# Patient Record
Sex: Male | Born: 1990 | Race: Black or African American | Hispanic: No | Marital: Single | Smoking: Never smoker
Health system: Southern US, Community
[De-identification: ages and names within clinical notes are randomized; demographics above are authoritative.]

## PROBLEM LIST (undated history)

## (undated) DIAGNOSIS — J45909 Unspecified asthma, uncomplicated: Secondary | ICD-10-CM

## (undated) DIAGNOSIS — T7840XA Allergy, unspecified, initial encounter: Secondary | ICD-10-CM

## (undated) DIAGNOSIS — F419 Anxiety disorder, unspecified: Secondary | ICD-10-CM

## (undated) HISTORY — DX: Unspecified asthma, uncomplicated: J45.909

## (undated) HISTORY — DX: Allergy, unspecified, initial encounter: T78.40XA

## (undated) HISTORY — DX: Anxiety disorder, unspecified: F41.9

---
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
## 2015-07-16 ENCOUNTER — Ambulatory Visit (INDEPENDENT_AMBULATORY_CARE_PROVIDER_SITE_OTHER): Payer: BLUE CROSS/BLUE SHIELD

## 2015-07-16 ENCOUNTER — Ambulatory Visit (INDEPENDENT_AMBULATORY_CARE_PROVIDER_SITE_OTHER): Payer: BLUE CROSS/BLUE SHIELD | Admitting: Emergency Medicine

## 2015-07-16 VITALS — BP 110/60 | HR 73 | Temp 98.0°F | Resp 20 | Ht 75.0 in | Wt 207.4 lb

## 2015-07-16 DIAGNOSIS — M79674 Pain in right toe(s): Secondary | ICD-10-CM | POA: Diagnosis not present

## 2015-07-16 DIAGNOSIS — S93502A Unspecified sprain of left great toe, initial encounter: Secondary | ICD-10-CM

## 2015-07-16 MED ORDER — NAPROXEN SODIUM 550 MG PO TABS: 550.0000 mg | ORAL_TABLET | Freq: Two times a day (BID) | ORAL | Status: AC

## 2015-07-16 NOTE — Progress Notes (Signed)
Subjective:  Patient ID: Andrew Todd, male    DOB: 06/01/1991  Age: 24 y.o. MRN: [id number]  CC: Toe Injury   HPI Andrew Todd presents   With an injury to his toe. Andrew Todd was playing football and Andrew Todd was great toe. Andrew Todd has pain with ambulation and weightbearing. Andrew Todd works in a [employer] and walks a great deal. Andrew Todd is concerned that Andrew Todd is not able to work because of pain. Andrew Todd denies any other complaints.  History Andrew Todd has a past medical history of Allergy; Asthma; and Anxiety.   Andrew Todd has no past surgical history on file.   His  family history is not on file.  Andrew Todd   reports that Andrew Todd has never smoked. Andrew Todd does not have any smokeless tobacco history on file. Andrew Todd reports that Andrew Todd drinks about 0.6 oz of alcohol per week. Andrew Todd reports that Andrew Todd does not use illicit drugs.  No outpatient prescriptions prior to visit.   No facility-administered medications prior to visit.    Social History   Social History  . Marital Status: Single    Spouse Name: N/A  . Number of Children: N/A  . Years of Education: N/A   Social History Main Topics  . Smoking status: Never Smoker   . Smokeless tobacco: None  . Alcohol Use: 0.6 oz/week    1 Standard drinks or equivalent per week  . Drug Use: No  . Sexual Activity: Not Asked   Other Topics Concern  . None   Social History Narrative  . None     Review of Systems  Constitutional: Negative for fever, chills and appetite change.  HENT: Negative for congestion, ear pain, postnasal drip, sinus pressure and sore throat.   Eyes: Negative for pain and redness.  Respiratory: Negative for cough, shortness of breath and wheezing.   Cardiovascular: Negative for leg swelling.  Gastrointestinal: Negative for nausea, vomiting, abdominal pain, diarrhea, constipation and blood in stool.  Endocrine: Negative for polyuria.  Genitourinary: Negative for dysuria, urgency, frequency and flank pain.  Musculoskeletal: Negative for gait problem.  Skin:  Negative for rash.  Neurological: Negative for weakness and headaches.  Psychiatric/Behavioral: Negative for confusion and decreased concentration. The patient is not nervous/anxious.     Objective:  BP 110/60 mmHg  Pulse 73  Temp(Src) 98 F (36.7 C) (Oral)  Resp 20  Ht 6\' 3"  (1.905 m)  Wt 207 lb 6.4 oz (94.076 kg)  BMI 25.92 kg/m2  SpO2 98%  Physical Exam  Constitutional: Andrew Todd is oriented to person, place, and time. Andrew Todd appears well-developed and well-nourished. No distress.  HENT:  Head: Normocephalic and atraumatic.  Right Ear: External ear normal.  Left Ear: External ear normal.  Nose: Nose normal.  Eyes: Conjunctivae and EOM are normal. Pupils are equal, round, and reactive to light. No scleral icterus.  Neck: Normal range of motion. Neck supple. No tracheal deviation present.  Cardiovascular: Normal rate, regular rhythm and normal heart sounds.   Pulmonary/Chest: Effort normal. No respiratory distress. Andrew Todd has no wheezes. Andrew Todd has no rales.  Abdominal: Andrew Todd exhibits no mass. There is no tenderness. There is no rebound and no guarding.  Musculoskeletal: Andrew Todd exhibits no edema.       Right foot: There is decreased range of motion and tenderness.   Andrew Todd has tenderness in his right toe on both  Metatarsal phalangeal joint in the DIP joint is no deformity or ecchymosis. No swellingl  Lymphadenopathy:    Andrew Todd has no cervical adenopathy.  Neurological:  Andrew Todd is alert and oriented to person, place, and time. Coordination normal.  Skin: Skin is warm and dry. No rash noted.  Psychiatric: Andrew Todd has a normal mood and affect. His behavior is normal.      Assessment & Plan:   Arrow was seen today for toe injury.  Diagnoses and all orders for this visit:  Pain of toe of right foot -     DG Toe Great Right; Future  Sprain of toe, great, left, initial encounter  Other orders -     naproxen sodium (ANAPROX DS) 550 MG tablet; Take 1 tablet (550 mg total) by mouth 2 (two) times daily with a  meal.  I am having [name] start on naproxen sodium.  Meds ordered this encounter  Medications  . naproxen sodium (ANAPROX DS) 550 MG tablet    Sig: Take 1 tablet (550 mg total) by mouth 2 (two) times daily with a meal.    Dispense:  40 tablet    Refill:  0    Appropriate red flag conditions were discussed with the patient as well as actions that should be taken.  Patient expressed his understanding.  Follow-up: Return if symptoms worsen or fail to improve.  Carmelina Dane, MD   [facility] reading (PRIMARY) by  Dr. Dareen Piano  negative.

## 2015-07-16 NOTE — Patient Instructions (Signed)
Turf Toe Turf toe is a condition of pain at the base of the big toe, located at the ball of the foot. The condition is usually caused from either jamming or extending the toe beyond normal limits (hyperextension). This is the result of pushing off repeatedly when running or jumping. The main problem is pain at the base of the toe, but there may also be stiffness and swelling. The name turf toe comes from the fact that this injury is especially common among athletes who play on hard surfaces, such as artificial turf and basketball courts. Hard surfaces combined with running and jumping makes this a common sports injury. DIAGNOSIS  The diagnosis of turftoeisnotdifficult. It is made by examination. X-rays may be taken to make sure there is nobreak in the bone (fracture). Not doing surgery (conservative treatment) solves the problem most of the time. Conservative treatment includes the following home care instructions. HOME CARE INSTRUCTIONS   Apply ice to the sore area for 15-20 minutes, 03-04 times per day while awake, for the first 4 days. Put the ice in a plastic bag and place a towel between the bag of ice and your skin. Use ice if possible following any activities, even after the first four days.  Keep your leg elevated when possible to lessen swelling and discomfort in the toe.  Use crutches with non-weight bearing on the affected foot for ten days, or as needed for pain. Then you may walk as the pain allows, or as instructed. Start gradually with weight bearing on the affected foot. Shoes with stiff soles will generally be helpful in limiting pain for the first 1 to 2 weeks.  Continue to use crutches or a cane until you can stand on your foot without causing pain.  Only take over-the-counter or prescription medicines for pain, discomfort, or fever as directed by your caregiver. SEEK IMMEDIATE MEDICAL CARE IF:   You have an increase in bruising, swelling, or pain in your toe.  Pain relief is  not obtained with medications. Turf toe can return, and problems may be slow to improve. This is more common if you return to athletic activities too soon and do not allow the problem to fully recover. Surgery is rarely needed, but in certain cases it may be necessary. If a bone spur forms and severely limits motion of the toe joint, surgery to remove the spur and improve motion of the big toe may be helpful.   This information is not intended to replace advice given to you by your health care provider. Make sure you discuss any questions you have with your health care provider.   Document Released: 12/26/2001 Document Revised: 09/28/2011 Document Reviewed: 01/16/2015 Elsevier Interactive Patient Education 2016 [employer]vier Inc.  

## 2017-03-10 ENCOUNTER — Encounter (HOSPITAL_COMMUNITY): Payer: Self-pay | Admitting: Emergency Medicine

## 2017-03-10 ENCOUNTER — Emergency Department (HOSPITAL_COMMUNITY)
Admission: EM | Admit: 2017-03-10 | Discharge: 2017-03-10 | Disposition: A | Discharge status: Home / Self Care | Payer: Self-pay | Attending: Emergency Medicine | Admitting: Emergency Medicine

## 2017-03-10 DIAGNOSIS — W57XXXA Bitten or stung by nonvenomous insect and other nonvenomous arthropods, initial encounter: Secondary | ICD-10-CM | POA: Insufficient documentation

## 2017-03-10 DIAGNOSIS — T148XXA Other injury of unspecified body region, initial encounter: Secondary | ICD-10-CM

## 2017-03-10 DIAGNOSIS — J45909 Unspecified asthma, uncomplicated: Secondary | ICD-10-CM | POA: Insufficient documentation

## 2017-03-10 DIAGNOSIS — Y998 Other external cause status: Secondary | ICD-10-CM | POA: Insufficient documentation

## 2017-03-10 DIAGNOSIS — Y929 Unspecified place or not applicable: Secondary | ICD-10-CM | POA: Insufficient documentation

## 2017-03-10 DIAGNOSIS — S30821A Blister (nonthermal) of abdominal wall, initial encounter: Secondary | ICD-10-CM | POA: Insufficient documentation

## 2017-03-10 DIAGNOSIS — Y939 Activity, unspecified: Secondary | ICD-10-CM | POA: Insufficient documentation

## 2017-03-10 NOTE — Discharge Instructions (Signed)
Based on your exam you have a blister, we do not know what caused it. At this time, we will watch and wait. Monitor the blister. Avoid poking it or rupturing it. If it bursts on its own, keep it clean with water and soap. Apply antibiotic ointment on it and keep clean. Return if symptoms worsen or you develop other concerning symptoms.

## 2017-03-10 NOTE — ED Provider Notes (Signed)
  Face-to-face evaluation   History: He presents for evaluation of a lesion on his abdominal wall, unknown etiology, present, 1 day.  No known trauma.  Recently visited the beach.  Physical exam: Alert, cooperative.  Right lower abdominal wall, with 2 small blistered areas, which are nontender, nonbleeding, nonpurulent.  These are nonspecific in appearance, do not indicate any deep tissue infection, signs or symptoms of arthropod bite or sting.  Medical screening examination/treatment/procedure(s) were conducted as a shared visit with non-physician practitioner(s) and myself.  I personally evaluated the patient during the encounter   Mancel Bale, MD 03/11/17 [phone]

## 2017-03-10 NOTE — ED Provider Notes (Signed)
[facility] Provider Note   CSN: [id number] Arrival date & time: 03/10/17  1530     History   Chief Complaint Chief Complaint  Patient presents with  . Insect Bite    HPI Andrew Todd is a 26 y.o. male presents to ED for evaluation of possible insect bite to right lower abdomen. He woke up 24 hr ago and noticed two bumps to his abdomen, non tender, non pruritic, no discharge. He thinks it was an insect. Lesions have been slightly enlarging. No alleviating or aggravating factors. States he went to the beach this past weekend, stayed at an [employer]. No other friends or family with similar lesions. Denies exposures to woods/ticks, fires or chemicals. No fever, myalgias, arthralgias, abdominal pain or other complaints today. Denies new sexual partners. No lesions in genitals.   HPI  Past Medical History:  Diagnosis Date  . Allergy   . Anxiety   . Asthma     Patient Active Problem List   Diagnosis Date Noted  . 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 FX CLOSED METATARSAL 03/10/2007  . RHINITIS, ALLERGIC 09/16/2006  . HAND PAIN 09/16/2006    History reviewed. No pertinent surgical history.     Home Medications    Prior to Admission medications   Not on File    Family History No family history on file.  Social History Social History  Substance Use Topics  . Smoking status: Never Smoker  . Smokeless tobacco: Never Used  . Alcohol use 0.6 oz/week    1 Standard drinks or equivalent per week     Allergies   Banana flavor   Review of Systems Review of Systems  Constitutional: Negative for fever.  HENT: Negative for sore throat.   Respiratory: Negative for cough.   Gastrointestinal: Negative for abdominal pain.  Musculoskeletal: Negative for arthralgias and myalgias.  Skin:       +lesion  Allergic/Immunologic: Negative for immunocompromised state.  Neurological: Negative for headaches.     Physical  Exam Updated Vital Signs BP (!) 121/48 (BP Location: Left Arm)   Pulse (!) 56   Temp 98.1 F (36.7 C) (Oral)   Resp 16   Ht 6\' 3"  (1.905 m)   Wt 93 kg (205 lb)   SpO2 100%   BMI 25.62 kg/m   Physical Exam  Constitutional: He is oriented to person, place, and time. He appears well-developed and well-nourished. No distress.  NAD.  HENT:  Head: Normocephalic and atraumatic.  Right Ear: External ear normal.  Left Ear: External ear normal.  Nose: Nose normal.  No perioral or intraoral lesions  Oropharynx and tonsils normal   Eyes: Conjunctivae and EOM are normal. No scleral icterus.  Neck: Normal range of motion. Neck supple.  Cardiovascular: Normal rate, regular rhythm, normal heart sounds and intact distal pulses.   No murmur heard. Pulmonary/Chest: Effort normal and breath sounds normal. He has no wheezes.  Abdominal: Soft. There is no tenderness.  Musculoskeletal: Normal range of motion. He exhibits no deformity.  Neurological: He is alert and oriented to person, place, and time.  Skin: Skin is warm and dry. Capillary refill takes less than 2 seconds.  Two vesicular lesions to lower right abdomen with very mild surrounding erythema. Non tender. Initially appears umbilicated but this change after palpation as fluid inside disperesed. Non umbilicated lesion.   Psychiatric: He has a normal mood and affect. His behavior is normal. Judgment and thought content normal.  Nursing note and vitals reviewed.      ED Treatments / Results  Labs (all labs ordered are listed, but only abnormal results are displayed) Labs Reviewed - No data to display  EKG  EKG Interpretation None       Radiology No results found.  Procedures Procedures (including critical care time)  Medications Ordered in ED Medications - No data to display   Initial Impression / Assessment and Plan / ED Course  I have reviewed the triage vital signs and the nursing notes.  Pertinent labs & imaging  results that were available during my care of the patient were reviewed by me and considered in my medical decision making (see chart for details).    Unknown etiology of blisters to abdomen. Considered exposure to allergen vs burn vs insect bite from the beach. Lesions are non pruritic and non tender. No other systemic symptoms. No lesions intraorally or periorally or in genitals. No lesions in palms or soles. Will opt for conservative tx at this time and watch and wait. Advised pt to avoid blister clean and avoid touching it. Wound care instructions given. Pt verbalized understanding with ED tx and plan. He is aware of symptoms to monitor for that would warrant return for evaluation.   Patient, ED treatment and discharge plan was discussed with supervising physician who also evaluated the patient and is agreeable with plan.   Final Clinical Impressions(s) / ED Diagnoses   Final diagnoses:  Blister    New Prescriptions New Prescriptions   No medications on file     Jerrell Mylar 03/10/17 Ocie Cornfield, MD 03/11/17 [phone]

## 2017-03-10 NOTE — ED Notes (Signed)
Declined W/C at D/C and was escorted to lobby by RN. 

## 2017-03-10 NOTE — ED Triage Notes (Signed)
Pt. Stated, I think I got bit by a spider yesterday , its on my stomach.

## 2018-08-09 ENCOUNTER — Encounter (HOSPITAL_COMMUNITY): Payer: Self-pay

## 2018-08-09 ENCOUNTER — Ambulatory Visit (HOSPITAL_COMMUNITY)
Admission: EM | Admit: 2018-08-09 | Discharge: 2018-08-09 | Disposition: A | Discharge status: Home / Self Care | Payer: Self-pay | Attending: Emergency Medicine | Admitting: Emergency Medicine

## 2018-08-09 DIAGNOSIS — H1032 Unspecified acute conjunctivitis, left eye: Secondary | ICD-10-CM | POA: Insufficient documentation

## 2018-08-09 MED ORDER — POLYMYXIN B-TRIMETHOPRIM 10000-0.1 UNIT/ML-% OP SOLN: 1.0000 [drp] | OPHTHALMIC | 0 refills | Status: DC

## 2018-08-09 NOTE — ED Triage Notes (Signed)
Pt c/o lt eye redness and drainage x2days

## 2018-08-09 NOTE — Discharge Instructions (Signed)
Conjunctivitis - Have Good Hand Hygiene  - Use Cold Compresses  - Use Polytrim eye drops (2 drops 4 times a day for 5-7 days)   Follow up if developing changes in vision, pain, persistent symptoms, swelling around eye

## 2018-08-09 NOTE — ED Provider Notes (Signed)
[facility]    CSN: [id number] Arrival date & time: 08/09/18  0846     History   Chief Complaint Chief Complaint  Patient presents with  . Eye Drainage    HPI Andrew Todd is a 28 y.o. male history of asthma presenting today for evaluation of left eye redness.  Patient began 3 days ago with a foreign body sensation to his left eye.  Since he has developed increased redness and watery drainage from the side.  Denies symptoms in right eye.  Denies change in vision, but has frequent blurriness related to the drainage.  He has tried over-the-counter eyedrops without relief.  Denies associated URI symptoms.  Denies contact use.  Denies exposure to anything that may have irritated eye or scratched eye.  HPI  Past Medical History:  Diagnosis Date  . Allergy   . Anxiety   . Asthma     Patient Active Problem List   Diagnosis Date Noted  . 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 FX CLOSED METATARSAL 03/10/2007  . RHINITIS, ALLERGIC 09/16/2006  . HAND PAIN 09/16/2006    History reviewed. No pertinent surgical history.     Home Medications    Prior to Admission medications   Medication Sig Start Date End Date Taking? Authorizing Provider  trimethoprim-polymyxin b (POLYTRIM) ophthalmic solution Place 1 drop into the left eye every 4 (four) hours. 08/09/18   Wieters, Junius Creamer, PA-C    Family History No family history on file.  Social History Social History   Tobacco Use  . Smoking status: Never Smoker  . Smokeless tobacco: Never Used  Substance Use Topics  . Alcohol use: Yes    Alcohol/week: 1.0 standard drinks    Types: 1 Standard drinks or equivalent per week  . Drug use: No     Allergies   Banana flavor   Review of Systems Review of Systems  Constitutional: Negative for activity change, appetite change, chills, fatigue and fever.  HENT: Negative for congestion, ear pain, rhinorrhea, sinus pressure, sore  throat and trouble swallowing.   Eyes: Positive for discharge, redness and visual disturbance. Negative for photophobia, pain and itching.  Respiratory: Negative for cough, chest tightness and shortness of breath.   Cardiovascular: Negative for chest pain.  Gastrointestinal: Negative for abdominal pain, diarrhea, nausea and vomiting.  Musculoskeletal: Negative for myalgias.  Skin: Negative for rash.  Neurological: Negative for dizziness, light-headedness and headaches.     Physical Exam Triage Vital Signs ED Triage Vitals  Enc Vitals Group     BP 08/09/18 0902 132/75     Pulse Rate 08/09/18 0902 (!) 57     Resp 08/09/18 0902 18     Temp 08/09/18 0902 97.9 F (36.6 C)     Temp Source 08/09/18 0902 Oral     SpO2 08/09/18 0902 100 %     Weight --      Height --      Head Circumference --      Peak Flow --      Pain Score 08/09/18 0903 0     Pain Loc --      Pain Edu? --      Excl. in GC? --    No data found.  Updated Vital Signs BP 132/75 (BP Location: Left Arm)   Pulse (!) 57   Temp 97.9 F (36.6 C) (Oral)   Resp 18   SpO2 100%   Visual Acuity Right Eye Distance:  20/40 Left Eye Distance:  20/40 Bilateral Distance:  20/30  Right Eye Near:   Left Eye Near:    Bilateral Near:     Physical Exam Vitals signs and nursing note reviewed.  Constitutional:      Appearance: He is well-developed.  HENT:     Head: Normocephalic and atraumatic.     Ears:     Comments: Bilateral ears without tenderness to palpation of external auricle, tragus and mastoid, EAC's without erythema or swelling, TM's with good bony landmarks and cone of light. Non erythematous.    Nose:     Comments: Nasal mucosa pink, nonswollen turbinates    Mouth/Throat:     Comments: Oral mucosa pink and moist, no tonsillar enlargement or exudate. Posterior pharynx patent and nonerythematous, no uvula deviation or swelling. Normal phonation. Eyes:     Extraocular Movements: Extraocular movements intact.       Pupils: Pupils are equal, round, and reactive to light.     Comments: Right conjunctiva-with erythema, limbus clear, watery drainage present  Neck:     Musculoskeletal: Neck supple.  Cardiovascular:     Rate and Rhythm: Normal rate and regular rhythm.     Heart sounds: No murmur.  Pulmonary:     Effort: Pulmonary effort is normal. No respiratory distress.     Breath sounds: Normal breath sounds.  Abdominal:     Palpations: Abdomen is soft.     Tenderness: There is no abdominal tenderness.  Skin:    General: Skin is warm and dry.  Neurological:     Mental Status: He is alert.      UC Treatments / Results  Labs (all labs ordered are listed, but only abnormal results are displayed) Labs Reviewed - No data to display  EKG None  Radiology No results found.  Procedures Procedures (including critical care time)  Medications Ordered in UC Medications - No data to display  Initial Impression / Assessment and Plan / UC Course  I have reviewed the triage vital signs and the nursing notes.  Pertinent labs & imaging results that were available during my care of the patient were reviewed by me and considered in my medical decision making (see chart for details).     Patient appears to have conjunctivitis, viral versus bacterial.  Unilateral, but watery drainage.  Will cover for bacterial with Polytrim as well as to provide lubrication from underlying irritation.  Visual acuity intact.  No surrounding swelling or erythema.  Cool compresses and hand hygiene.  Continue to monitor.Discussed strict return precautions. Patient verbalized understanding and is agreeable with plan.  Final Clinical Impressions(s) / UC Diagnoses   Final diagnoses:  Acute bacterial conjunctivitis of left eye     Discharge Instructions     Conjunctivitis - Have Good Hand Hygiene  - Use Cold Compresses  - Use Polytrim eye drops (2 drops 4 times a day for 5-7 days)   Follow up if developing  changes in vision, pain, persistent symptoms, swelling around eye       ED Prescriptions    Medication Sig Dispense Auth. Provider   trimethoprim-polymyxin b (POLYTRIM) ophthalmic solution Place 1 drop into the left eye every 4 (four) hours. 10 mL Wieters, Hallie C, PA-C     Controlled Substance Prescriptions [place] Controlled Substance Registry consulted? Not Applicable   Lew DawesWieters, Hallie C, [place]PA-C 08/09/18 [phone]

## 2018-08-15 ENCOUNTER — Encounter (HOSPITAL_COMMUNITY): Payer: Self-pay | Admitting: Emergency Medicine

## 2018-08-15 ENCOUNTER — Other Ambulatory Visit: Payer: Self-pay

## 2018-08-15 ENCOUNTER — Telehealth (HOSPITAL_COMMUNITY): Payer: Self-pay | Admitting: Urgent Care

## 2018-08-15 ENCOUNTER — Telehealth (HOSPITAL_COMMUNITY): Payer: Self-pay | Admitting: Emergency Medicine

## 2018-08-15 ENCOUNTER — Ambulatory Visit (HOSPITAL_COMMUNITY)
Admission: EM | Admit: 2018-08-15 | Discharge: 2018-08-15 | Disposition: A | Discharge status: Home / Self Care | Payer: Self-pay | Attending: Urgent Care | Admitting: Urgent Care

## 2018-08-15 DIAGNOSIS — L03213 Periorbital cellulitis: Secondary | ICD-10-CM | POA: Insufficient documentation

## 2018-08-15 DIAGNOSIS — H5712 Ocular pain, left eye: Secondary | ICD-10-CM | POA: Insufficient documentation

## 2018-08-15 MED ORDER — TOBRAMYCIN-DEXAMETHASONE 0.3-0.1 % OP OINT: 1.0000 "application " | TOPICAL_OINTMENT | Freq: Three times a day (TID) | OPHTHALMIC | 0 refills | Status: DC

## 2018-08-15 MED ORDER — ERYTHROMYCIN 5 MG/GM OP OINT
TOPICAL_OINTMENT | OPHTHALMIC | 0 refills | Status: DC
Start: 2018-08-15 — End: 2018-09-28

## 2018-08-15 MED ORDER — ERYTHROMYCIN 5 MG/GM OP OINT: TOPICAL_OINTMENT | OPHTHALMIC | 0 refills | Status: DC

## 2018-08-15 MED ORDER — CEFDINIR 300 MG PO CAPS: 300.0000 mg | ORAL_CAPSULE | Freq: Two times a day (BID) | ORAL | 0 refills | Status: DC

## 2018-08-15 NOTE — ED Provider Notes (Signed)
  MRN: [id number] DOB: Dec 10, 1990  Subjective:   Andrew Todd is a 28 y.o. male presenting for persistent eye problem.  Patient was last seen on August 19, 2018, started on Polytrim.  Today he reports no improvement.  Still has left eye redness, now has more persistent moderate pain of his eye and eyelid, mild foreign body sensation, pretty constant clear drainage of his eye.  States that he tried to contact the ophthalmologist office today but could not get through.  No current facility-administered medications for this encounter.   Current Outpatient Medications:  .  trimethoprim-polymyxin b (POLYTRIM) ophthalmic solution, Place 1 drop into the left eye every 4 (four) hours., Disp: 10 mL, Rfl: 0    Allergies  Allergen Reactions  . Banana Flavor     Past Medical History:  Diagnosis Date  . Allergy   . Anxiety   . Asthma      Denies past surgical history.  ROS Denies fever, eye trauma, pain with light, matted eyes, sinus pain, sinus congestion, ear pain, throat pain, cough.  Objective:   Vitals: BP (!) 123/59 (BP Location: Right Arm)   Pulse (!) 57   Temp 98.8 F (37.1 C) (Temporal)   SpO2 100%   Physical Exam Constitutional:      Appearance: Normal appearance. He is well-developed and normal weight.  HENT:     Head: Normocephalic and atraumatic.     Right Ear: External ear normal.     Left Ear: External ear normal.     Nose: Nose normal.     Mouth/Throat:     Pharynx: Oropharynx is clear.  Eyes:     General: Lids are everted, no foreign bodies appreciated. No scleral icterus.       Left eye: Discharge (Clear and watery) present.    Extraocular Movements: Extraocular movements intact.     Right eye: Normal extraocular motion.     Left eye: Normal extraocular motion.     Conjunctiva/sclera:     Left eye: Left conjunctiva is injected. Chemosis present. No exudate or hemorrhage.    Pupils: Pupils are equal, round, and reactive to light.     Comments: Patient  has mild upper and lower eyelid swelling of left side.  Cardiovascular:     Rate and Rhythm: Normal rate.  Pulmonary:     Effort: Pulmonary effort is normal.  Neurological:     Mental Status: He is alert and oriented to person, place, and time.  Psychiatric:        Mood and Affect: Mood normal.        Behavior: Behavior normal.    Assessment and Plan :   Preseptal cellulitis of left eye  Left eye pain  Stop Polytrim, start TobraDex and cefdinir.  Patient did not want to take an additional oral antibiotic with what he already is being prescribed.  He was very agreeable to setting up follow-up with [facility], the eye practice on-call.  Recommended he try to set up follow-up in 1 to 2 days. Counseled patient on potential for adverse effects with medications prescribed today, patient verbalized understanding. ER and return-to-clinic precautions discussed, patient verbalized understanding.    Wallis Bamberg, PA-C 08/15/18 1141

## 2018-08-15 NOTE — ED Triage Notes (Signed)
Pt here for continued problems with his left eye.  Pt was treated for conjunctivis on 08/09/18.  Pt states he used the drops as prescribed and has had no improvement in the eye.  He tried to contact an eye doctor today but states he could not get through.

## 2018-08-15 NOTE — Discharge Instructions (Signed)
Contact Dr. Eliane Decree office and set up a follow up appointment for urgent visit in 1-2 days. [facility] Address: [address], [place], [place] [address] Phone: [phone]

## 2018-08-15 NOTE — Telephone Encounter (Signed)
Patient called and reported that TobraDex was very expensive as it was not available on generic.  We will print prescription for TobraDex to be filled generic and if this is still too expensive patient needs to try erythromycin.

## 2018-08-15 NOTE — Telephone Encounter (Signed)
Pt had medications sent to [facility], but the Rx's were too expensive.  [facility] [place] to use [email or web address] for discount.  [facility]

## 2018-09-28 ENCOUNTER — Ambulatory Visit (HOSPITAL_COMMUNITY)
Admission: EM | Admit: 2018-09-28 | Discharge: 2018-09-28 | Disposition: A | Discharge status: Home / Self Care | Payer: Self-pay | Attending: Family Medicine | Admitting: Family Medicine

## 2018-09-28 ENCOUNTER — Encounter (HOSPITAL_COMMUNITY): Payer: Self-pay | Admitting: Emergency Medicine

## 2018-09-28 DIAGNOSIS — M25562 Pain in left knee: Secondary | ICD-10-CM

## 2018-09-28 NOTE — ED Provider Notes (Signed)
[facility]   [id number] 09/28/18 Arrival Time: 1209  VB:TYOMA PAIN  SUBJECTIVE: History from: patient. Andrew Todd is a 28 y.o. male complains of left knee pain that began a couple of weeks ago.  Denies a precipitating event or specific injury, first noticed symptoms after playing basketball.  Localizes the pain to the front and inside of knee.  Describes the pain as intermittent.  Has NOT tried OTC medications.  Symptoms are made worse with standing from a seat position.  Denies similar symptoms in the past.  Denies fever, chills, erythema, ecchymosis, effusion, weakness, numbness and tingling.      ROS: As per HPI.  Past Medical History:  Diagnosis Date  . Allergy   . Anxiety   . Asthma    History reviewed. No pertinent surgical history. Allergies  Allergen Reactions  . Banana Flavor    No current facility-administered medications on file prior to encounter.    No current outpatient medications on file prior to encounter.   Social History   Socioeconomic History  . Marital status: Single    Spouse name: Not on file  . Number of children: Not on file  . Years of education: Not on file  . Highest education level: Not on file  Occupational History  . Not on file  Social Needs  . Financial resource strain: Not on file  . Food insecurity:    Worry: Not on file    Inability: Not on file  . Transportation needs:    Medical: Not on file    Non-medical: Not on file  Tobacco Use  . Smoking status: Never Smoker  . Smokeless tobacco: Never Used  Substance and Sexual Activity  . Alcohol use: Yes    Alcohol/week: 1.0 standard drinks    Types: 1 Standard drinks or equivalent per week  . Drug use: No  . Sexual activity: Not on file  Lifestyle  . Physical activity:    Days per week: Not on file    Minutes per session: Not on file  . Stress: Not on file  Relationships  . Social connections:    Talks on phone: Not on file    Gets together: Not on file    Attends religious service: Not on file    Active member of club or organization: Not on file    Attends meetings of clubs or organizations: Not on file    Relationship status: Not on file  . Intimate partner violence:    Fear of current or ex partner: Not on file    Emotionally abused: Not on file    Physically abused: Not on file    Forced sexual activity: Not on file  Other Topics Concern  . Not on file  Social History Narrative  . Not on file   No family history on file.  OBJECTIVE:  Vitals:   09/28/18 1234  BP: 126/64  Pulse: 61  Resp: 18  Temp: 98 F (36.7 C)  SpO2: 100%    General appearance: Alert; in no acute distress.  Head: NCAT Lungs: Normal respiratory effort Musculoskeletal: Left knee Inspection: Skin warm, dry, clear and intact without obvious erythema, effusion, or ecchymosis.  Palpation: TTP over MJL ROM: LROM with knee flexion Strength:  5/5 knee flexion, 5/5 knee extension, 5/5 dorsiflexion, 5/5 plantar flexion Stability: Anterior/ posterior drawer intact; - lachman Skin: warm and dry Neurologic: Ambulates without difficulty; Sensation intact about the lower extremities Psychological: alert and cooperative; normal mood and affect  ASSESSMENT & PLAN:  1. Acute pain of left knee   2. Medial joint line tenderness of knee, left    Declines x-ray today Knee brace applied Continue conservative management of rest, ice, elevation Begin taking OTC ibuprofen 400 mg every 6 hours as needed for pain Follow up with orthopedist for further evaluation and mangement Return or go to the ER if you have any new or worsening symptoms (fever, chills, swelling, redness, worsening pain, numbness or tingling, chest pain, abdominal pain, changes in bowel or bladder habits, pain radiating into lower legs, etc...)   Reviewed expectations re: course of current medical issues. Questions answered. Outlined signs and symptoms indicating need for more acute intervention.  Patient verbalized understanding. After Visit Summary given.    Rennis Harding, PA-C 09/28/18 1409

## 2018-09-28 NOTE — ED Triage Notes (Signed)
Pt states a week and a half ago he was playing ball and after noticed his L knee was giving him pain. No speciifc injury, states its been hurting ever since.

## 2018-09-28 NOTE — Discharge Instructions (Signed)
Declines x-ray today Knee brace applied Continue conservative management of rest, ice, elevation Begin taking OTC ibuprofen 400 mg every 6 hours as needed for pain Follow up with orthopedist for further evaluation and mangement Return or go to the ER if you have any new or worsening symptoms (fever, chills, swelling, redness, worsening pain, numbness or tingling, chest pain, abdominal pain, changes in bowel or bladder habits, pain radiating into lower legs, etc...)

## 2018-09-30 ENCOUNTER — Encounter (HOSPITAL_COMMUNITY): Payer: Self-pay | Admitting: Emergency Medicine

## 2018-09-30 ENCOUNTER — Emergency Department (HOSPITAL_COMMUNITY)
Admission: EM | Admit: 2018-09-30 | Discharge: 2018-09-30 | Disposition: A | Discharge status: Home / Self Care | Payer: No Typology Code available for payment source | Attending: Emergency Medicine | Admitting: Emergency Medicine

## 2018-09-30 ENCOUNTER — Emergency Department (HOSPITAL_COMMUNITY): Payer: No Typology Code available for payment source

## 2018-09-30 DIAGNOSIS — M25562 Pain in left knee: Secondary | ICD-10-CM | POA: Diagnosis not present

## 2018-09-30 DIAGNOSIS — J45909 Unspecified asthma, uncomplicated: Secondary | ICD-10-CM | POA: Insufficient documentation

## 2018-09-30 MED ORDER — ACETAMINOPHEN 500 MG PO TABS
500.0000 mg | ORAL_TABLET | Freq: Four times a day (QID) | ORAL | 0 refills | Status: AC | PRN
Start: 2018-09-30 — End: ?

## 2018-09-30 MED ORDER — MELOXICAM 15 MG PO TABS: 15.0000 mg | ORAL_TABLET | Freq: Every day | ORAL | 0 refills | Status: AC
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
## 2018-09-30 NOTE — Discharge Instructions (Signed)
Take Mobic once daily for your pain.  Do not combine this medication with Aleve, Naprosyn, ibuprofen, Motrin, Advil.  You can alternate Mobic with Tylenol as prescribed.  Use ice 3-4 times daily alternating 20 minutes on, 20 minutes off.  Elevate your leg whenever you are not walking on it.  Use crutches as needed.  Please follow-up with the orthopedic doctor recommended you at urgent care, Dr. Aundria Rud.  Please return emergency department develop any new or worsening symptoms.

## 2024-02-08 ENCOUNTER — Other Ambulatory Visit (HOSPITAL_BASED_OUTPATIENT_CLINIC_OR_DEPARTMENT_OTHER): Payer: Self-pay

## 2024-02-08 ENCOUNTER — Emergency Department (HOSPITAL_BASED_OUTPATIENT_CLINIC_OR_DEPARTMENT_OTHER)
Admission: EM | Admit: 2024-02-08 | Discharge: 2024-02-08 | Disposition: A | Discharge status: Home / Self Care | Payer: Self-pay | Attending: Emergency Medicine | Admitting: Emergency Medicine

## 2024-02-08 ENCOUNTER — Other Ambulatory Visit: Payer: Self-pay

## 2024-02-08 DIAGNOSIS — L84 Corns and callosities: Secondary | ICD-10-CM | POA: Insufficient documentation

## 2024-02-08 DIAGNOSIS — R21 Rash and other nonspecific skin eruption: Secondary | ICD-10-CM

## 2024-02-08 MED ORDER — HYDROCORTISONE 2.5 % EX LOTN: TOPICAL_LOTION | Freq: Two times a day (BID) | CUTANEOUS | 0 refills | Status: DC

## 2024-02-08 MED ORDER — HYDROCORTISONE 2.5 % EX CREA
TOPICAL_CREAM | CUTANEOUS | 0 refills | Status: AC
  Filled 2024-02-08: qty 15, fill #0
  Filled 2024-02-08: qty 30, 30d supply, fill #0

## 2024-02-08 MED ORDER — PREDNISONE 10 MG (21) PO TBPK: ORAL_TABLET | Freq: Every day | ORAL | 0 refills | Status: DC

## 2024-02-08 MED ORDER — PREDNISONE 10 MG PO TABS
ORAL_TABLET | ORAL | 0 refills | Status: AC
  Filled 2024-02-08: qty 21, 6d supply, fill #0

## 2024-02-08 MED ORDER — HYDROCORTISONE 2.5 % EX LOTN
1.0000 | TOPICAL_LOTION | Freq: Two times a day (BID) | CUTANEOUS | 0 refills | Status: DC
  Filled 2024-02-08: qty 59, 30d supply, fill #0

## 2024-02-08 NOTE — ED Notes (Signed)
 Reviewed AVS/discharge instruction with patient. Time allotted for and all questions answered. Patient is agreeable for d/c and escorted to ed exit by staff.

## 2024-02-08 NOTE — ED Provider Notes (Signed)
 [place] EMERGENCY DEPARTMENT AT [facility] Provider Note   CSN: [id number] Arrival date & time: 02/08/24  1247    Patient presents with: Rash   Andrew Todd is a 33 y.o. male here for evaluation of rash.  Started about 1 week ago.  Pruritic in nature.  Rash to bilateral upper forearms, left neck.  Using OTC steroids without relief.  No rash mucous membranes.  Rash nonpainful.  No changes in medications, URI symptoms.  No blistering or weeping lesions.  No sensation of throat closing.  No new lotions, perfumes or detergents.  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  No OTC medications or prescription medications. No soles, palm. No GU rash.   HPI     Prior to Admission medications   Medication Sig Start Date End Date Taking? Authorizing Provider  hydrocortisone  2.5 % lotion Apply topically 2 (two) times daily. 02/08/24  Yes Aubryanna Nesheim A, PA-C  predniSONE  (STERAPRED UNI-PAK 21 TAB) 10 MG (21) TBPK tablet Take by mouth daily. Take 6 tabs by mouth daily  for 1 days, then 5 tabs for 1 days, then 4 tabs for 1 days, then 3 tabs for 1 days, 2 tabs for 1 days, then 1 tab by mouth daily for 1 days 02/08/24  Yes Ronisha Herringshaw A, PA-C  acetaminophen  (TYLENOL ) 500 MG tablet Take 1 tablet (500 mg total) by mouth every 6 (six) hours as needed. Patient not taking: Reported on 02/08/2024 09/30/18   Law, Alexandra M, PA-C  meloxicam  (MOBIC ) 15 MG tablet Take 1 tablet (15 mg total) by mouth daily. Patient not taking: Reported on 02/08/2024 09/30/18   Rendell Lorane HERO, PA-C    Allergies: Banana flavoring agent (non-screening)    Review of Systems  Constitutional: Negative.   HENT: Negative.    Respiratory: Negative.    Cardiovascular: Negative.   Gastrointestinal: Negative.   Genitourinary: Negative.   Musculoskeletal: Negative.   Skin:  Positive for rash.  Neurological: Negative.   All other systems reviewed and are  negative.   Updated Vital Signs BP 129/66 (BP Location: Right Arm)   Pulse 65   Temp 98 F (36.7 C)   Resp 16   SpO2 98%   Physical Exam Vitals and nursing note reviewed.  Constitutional:      General: He is not in acute distress.    Appearance: He is well-developed. He is not ill-appearing, toxic-appearing or diaphoretic.  HENT:     Head: Normocephalic and atraumatic.     Mouth/Throat:     Lips: Pink.     Mouth: Mucous membranes are moist.     Pharynx: Oropharynx is clear. Uvula midline.     Comments: No intraoral lesions Eyes:     Pupils: Pupils are equal, round, and reactive to light.  Cardiovascular:     Rate and Rhythm: Normal rate and regular rhythm.  Pulmonary:     Effort: Pulmonary effort is normal. No respiratory distress.  Abdominal:     General: There is no distension.     Palpations: Abdomen is soft.  Musculoskeletal:        General: Normal range of motion.     Cervical back: Normal range of motion and neck supple.     Comments: Full rom, compartments soft.   Skin:    General: Skin is warm and dry.     Findings: Rash present.     Comments: Erythematous papules.  No fluctuance, induration.  No vesicles, bulla, target lesions.  No mucous membrane involvement, no Koplik spots.  No Janeway lesions, Osler nodes, petechiae, purpura. Calluses to palmar aspect hands BIL  Neurological:     General: No focal deficit present.     Mental Status: He is alert and oriented to person, place, and time.     Cranial Nerves: No cranial nerve deficit.     Sensory: No sensory deficit.     Motor: No weakness.     Gait: Gait normal.     (all labs ordered are listed, but only abnormal results are displayed) Labs Reviewed - No data to display  EKG: None  Radiology: No results found.   Procedures   Medications Ordered in the ED - No data to display   Here for evaluation of rash.  Started about a week ago.  Pruritic in nature.  Nontender.  Doing topical hydrocortisone   without relief.  No URI symptoms.  No evidence of allergic reaction.  Suspect likely contact dermatitis. Patient denies any difficulty breathing or swallowing.  Pt has a patent airway without stridor and is handling secretions without difficulty; no angioedema. No blisters, no pustules, no warmth, no draining sinus tracts, no superficial abscesses, no bullous impetigo, no vesicles, no desquamation, no target lesions with dusky purpura or a central bulla. Not tender to touch. No concern for superimposed infection. No concern for SJS, TEN, TSS, tick borne illness, syphilis or other life-threatening condition. Calluses to palmar aspect hands- chronic likely from physical labor at work. Will discharge home with short course of steroids, and recommend Benadryl as needed for pruritis.   Follow-up with PCP if symptoms do not improve, return for worsening symptoms.  The patient has been appropriately medically screened and/or stabilized in the ED. I have low suspicion for any other emergent medical condition which would require further screening, evaluation or treatment in the ED or require inpatient management.  Patient is hemodynamically stable and in no acute distress.  Patient able to ambulate in department prior to ED.  Evaluation does not show acute pathology that would require ongoing or additional emergent interventions while in the emergency department or further inpatient treatment.  I have discussed the diagnosis with the patient and answered all questions.  Pain is been managed while in the emergency department and patient has no further complaints prior to discharge.  Patient is comfortable with plan discussed in room and is stable for discharge at this time.  I have discussed strict return precautions for returning to the emergency department.  Patient was encouraged to follow-up with PCP/specialist refer to at discharge.                                   Medical Decision Making Amount and/or  Complexity of Data Reviewed External Data Reviewed: labs, radiology and notes.  Risk OTC drugs. Prescription drug management. Decision regarding hospitalization. Diagnosis or treatment significantly limited by social determinants of health.       Final diagnoses:  Rash    ED Discharge Orders          Ordered    predniSONE  (STERAPRED UNI-PAK 21 TAB) 10 MG (21) TBPK tablet  Daily        02/08/24 1429    hydrocortisone  2.5 % lotion  2 times daily        02/08/24 1430  Edie Rosebud LABOR, PA-C 02/08/24 1445    Geraldene Hamilton, MD 02/09/24 [phone]

## 2024-02-08 NOTE — ED Triage Notes (Signed)
 C/o rash on both arms and neck x 1 months. No hx of skin issues. States OTC creams have not worked.

## 2024-02-08 NOTE — Discharge Instructions (Addendum)
 It was a pleasure taking care of you here today.  I started on a prednisone  taper as well as a stronger steroid lotion.  Please use caution as scratch 1 area you may spread the rash.  Follow-up outpatient with primary care provider in 1 week if symptoms do not improve  Return for new or worsening symptoms
